# Patient Record
Sex: Male | Born: 1965 | Hispanic: No | Marital: Single | Smoking: Current every day smoker
Health system: Southern US, Community
[De-identification: ages and names within clinical notes are randomized; demographics above are authoritative.]

## PROBLEM LIST (undated history)

## (undated) DIAGNOSIS — J449 Chronic obstructive pulmonary disease, unspecified: Secondary | ICD-10-CM

## (undated) DIAGNOSIS — R0789 Other chest pain: Secondary | ICD-10-CM

## (undated) DIAGNOSIS — R51 Headache: Secondary | ICD-10-CM

## (undated) DIAGNOSIS — K219 Gastro-esophageal reflux disease without esophagitis: Secondary | ICD-10-CM

## (undated) DIAGNOSIS — R0602 Shortness of breath: Secondary | ICD-10-CM

## (undated) HISTORY — PX: TOTAL KNEE ARTHROPLASTY: SHX125

## (undated) HISTORY — PX: COLONOSCOPY: SHX174

## (undated) HISTORY — PX: KNEE SURGERY: SHX244

---

## 2014-05-05 ENCOUNTER — Emergency Department (HOSPITAL_COMMUNITY): Payer: Self-pay

## 2014-05-05 ENCOUNTER — Emergency Department (HOSPITAL_COMMUNITY)
Admission: EM | Admit: 2014-05-05 | Discharge: 2014-05-05 | Disposition: A | Discharge status: Home / Self Care | Payer: Self-pay | Attending: Emergency Medicine | Admitting: Emergency Medicine

## 2014-05-05 ENCOUNTER — Encounter (HOSPITAL_COMMUNITY): Payer: Self-pay | Admitting: Emergency Medicine

## 2014-05-05 DIAGNOSIS — S8990XA Unspecified injury of unspecified lower leg, initial encounter: Secondary | ICD-10-CM | POA: Insufficient documentation

## 2014-05-05 DIAGNOSIS — Z96659 Presence of unspecified artificial knee joint: Secondary | ICD-10-CM | POA: Insufficient documentation

## 2014-05-05 DIAGNOSIS — Z87891 Personal history of nicotine dependence: Secondary | ICD-10-CM | POA: Insufficient documentation

## 2014-05-05 DIAGNOSIS — X500XXA Overexertion from strenuous movement or load, initial encounter: Secondary | ICD-10-CM | POA: Insufficient documentation

## 2014-05-05 DIAGNOSIS — S99929A Unspecified injury of unspecified foot, initial encounter: Secondary | ICD-10-CM

## 2014-05-05 DIAGNOSIS — S99919A Unspecified injury of unspecified ankle, initial encounter: Secondary | ICD-10-CM

## 2014-05-05 DIAGNOSIS — S86811A Strain of other muscle(s) and tendon(s) at lower leg level, right leg, initial encounter: Secondary | ICD-10-CM

## 2014-05-05 DIAGNOSIS — Z79899 Other long term (current) drug therapy: Secondary | ICD-10-CM | POA: Insufficient documentation

## 2014-05-05 DIAGNOSIS — Y9389 Activity, other specified: Secondary | ICD-10-CM | POA: Insufficient documentation

## 2014-05-05 DIAGNOSIS — Y9289 Other specified places as the place of occurrence of the external cause: Secondary | ICD-10-CM | POA: Insufficient documentation

## 2014-05-05 DIAGNOSIS — S86819A Strain of other muscle(s) and tendon(s) at lower leg level, unspecified leg, initial encounter: Principal | ICD-10-CM

## 2014-05-05 DIAGNOSIS — J449 Chronic obstructive pulmonary disease, unspecified: Secondary | ICD-10-CM | POA: Insufficient documentation

## 2014-05-05 DIAGNOSIS — J4489 Other specified chronic obstructive pulmonary disease: Secondary | ICD-10-CM | POA: Insufficient documentation

## 2014-05-05 DIAGNOSIS — S838X9A Sprain of other specified parts of unspecified knee, initial encounter: Secondary | ICD-10-CM | POA: Insufficient documentation

## 2014-05-05 DIAGNOSIS — IMO0002 Reserved for concepts with insufficient information to code with codable children: Secondary | ICD-10-CM | POA: Insufficient documentation

## 2014-05-05 DIAGNOSIS — Z880 Allergy status to penicillin: Secondary | ICD-10-CM | POA: Insufficient documentation

## 2014-05-05 HISTORY — DX: Chronic obstructive pulmonary disease, unspecified: J44.9

## 2014-05-05 MED ORDER — OXYCODONE-ACETAMINOPHEN 5-325 MG PO TABS
1.0000 | ORAL_TABLET | Freq: Once | ORAL | Status: AC
  Administered 2014-05-05: 1 via ORAL
  Filled 2014-05-05: qty 1

## 2014-05-05 MED ORDER — OXYCODONE-ACETAMINOPHEN 5-325 MG PO TABS: 1.0000 | ORAL_TABLET | ORAL | Status: DC | PRN
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
## 2014-05-05 NOTE — Discharge Instructions (Addendum)
You may remove the ace wrap and knee immobilizer to shower.  Otherwise keep the Ace wrap on to help control swelling.  Wear the knee immobilizer to support your knee.  Use crutches to get around.  Don't bear weight on your right foot.  My office will call you Monday to schedule your surgery on Tuesday.  The ph Complete Achilles Tendon Rupture An Achilles tendon rupture is an injury in which the tough, cord-like band that attaches the lower muscles of your leg to your heel (Achilles tendon) tears (ruptures). In a complete Achilles tendon rupture, you are not able to stand up on the toes of the injured leg. CAUSES A tendon may rupture if it is weakened or weakening (degenerative) and a sudden stress is applied to it. Weakening or degeneration of a tendon may be caused by:  Recurrent injuries, such as those causing Achilles tendinitis.  Damaged tendons.  Aging.  Vascular disease of the tendon. SIGNS AND SYMPTOMS  Feeling as if you were struck violently in the back of the ankle.  Hearing a "pop" and experiencing severe, sudden (acute) pain; however, the absence of pain does not mean there was not a rupture. DIAGNOSIS A physical exam is usually all that is needed to diagnose an Achilles tendon rupture. During the exam, your health care provider will touch the tendon and the structures around it. You may be asked to lie on your stomach or kneel on a chair while your health care provider squeezes your calf muscle. You most likely have a ruptured tendon if your foot does not flex.  Sometimes tests are performed. These may include:  An ultrasound. This allows quick confirmation of the diagnosis.  An X-ray.  An MRI. TREATMENT  A complete Achilles tendon rupture is treated with surgery. You will have a cast while the injury heals (this usually takes 6-10 weeks). Before your surgery, treatment may consist of:  Ice applied to the area.  Pain-relieving medicines.  Rest.  Crutches.  Keeping  the ankle from moving (immobilization), usually with a splint. HOME CARE INSTRUCTIONS   Apply ice to the injured area:   Put ice in a plastic bag.   Place a towel between your skin and the bag.   Leave the ice on for 20 minutes, 2-3 times a day.   Use crutches and move about only as instructed.   Keep the leg elevated above the level of the heart (the center of the chest) at all times when not using the bathroom. Do not dangle the leg over a chair, couch, or bed. When lying down, elevate your leg on a few pillows. Elevation prevents swelling and reduces pain.  Avoid use of the injured area other than gentle range of motion of the toes.  Do not drive a car until your health care provider specifically tells you it is safe to do so.  Take all medicines as directed by your health care provider.  Keep all follow-up visits as directed by your health care provider. SEEK MEDICAL CARE IF:   Your pain and swelling increase, or your pain is not controlled by medicines.   You have new, unexplained symptoms, or your symptoms get worse.  You cannot move your toes or foot.  You develop warmth and swelling in your foot.  You have an unexplained fever. MAKE SURE YOU:   Understand these instructions.  Will watch your condition.  Will get help right away if you are not doing well or get worse. Document Released:  06/18/2005 Document Revised: 01/23/2014 Document Reviewed: 04/29/2013 [facility] Patient Information 2015 [employer] [place] [employer]. This information is not intended to replace advice given to you by your health care provider. Make sure you discuss any questions you have with your health care provider. one number is [phone] if you have any questions.

## 2014-05-05 NOTE — ED Notes (Signed)
Bed: WA17 Expected date:  Expected time:  Means of arrival:  Comments: EMS-fall-knee pain

## 2014-05-05 NOTE — ED Notes (Signed)
Pt states he's having pain under R knee cap, no deformity noted, good pulses in R foot.
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
## 2014-05-05 NOTE — Progress Notes (Signed)
  CARE MANAGEMENT ED NOTE 05/05/2014  Patient:  Angel Vasquez,Angel Vasquez   Account Number:  [id number]  Date Initiated:  05/05/2014  Documentation initiated by:  Edd ArbourGIBBS,Alainah Phang  Subjective/Objective Assessment:   48 yr [place] now [place] "[place]"     Subjective/Objective Assessment Detail:   no pcp listed pt from home, he was pressure washing a building in the back yard, the back yard is steep hill, no grass so made dirt muddy, was wearing croc shoes, slips and falls out of shoes, R foot got caught under him and he fell on leg, complaining of R knee pain, hx of knee surgeries. No deformities noted.    Pt agreed to referral to [facility]     Action/Plan:   ED CM noted pt without pcp [place] resident ED CM spoke with him & male at bedside Cm provided pt with resources as listed below Encouraged to ask questions of Cm and to seek pcp f/u after ED visit   Action/Plan Detail:   Provided [facility] with referral   Anticipated DC Date:  05/05/2014     Status Recommendation to Physician:   Result of Recommendation:    Other ED Services  Consult Working Plan    DC Planning Services  Other  Outpatient Services - Pt will follow up  PCP issues  [facility] / [facility] (established/new)    Choice offered to / List presented to:            Status of service:  Completed, signed off  ED Comments:   ED Comments Detail:  CM spoke with pt who confirms [place] [facility] [place]. CM discussed and provided written information for self pay pcps, importance of pcp for f/u care, www.needymeds.org, discounted pharmacies and other [place] resources such as financial assistance, DSS and  health department  Reviewed resources for [place] pcps like [facility], [facility] medicine at [address], [facility], general medical clinics, [facility] plus others, [facility] out patient pharmacies and housing Pt voiced understanding and appreciation of  resources provided  Provided [facility] contact information

## 2014-05-05 NOTE — ED Notes (Signed)
Per EMS pt from home, he was pressure washing a building in the back yard, the back yard is steep hill, no grass so made dirt muddy, was wearing croc shoes, slips and falls out of shoes, R foot got caught under him and he fell on leg, complaining of R knee pain, hx of knee surgeries. No deformities noted.

## 2014-05-08 ENCOUNTER — Encounter (HOSPITAL_COMMUNITY): Payer: Self-pay | Admitting: *Deleted

## 2014-05-08 ENCOUNTER — Encounter (HOSPITAL_COMMUNITY): Payer: Self-pay | Admitting: Pharmacy Technician

## 2014-05-09 ENCOUNTER — Observation Stay (HOSPITAL_COMMUNITY)
Admission: RE | Admit: 2014-05-09 | Discharge: 2014-05-10 | Disposition: A | Discharge status: Home / Self Care | Payer: Self-pay | Source: Ambulatory Visit | Attending: Orthopedic Surgery | Admitting: Orthopedic Surgery

## 2014-05-09 ENCOUNTER — Ambulatory Visit (HOSPITAL_COMMUNITY): Payer: Self-pay | Admitting: Anesthesiology

## 2014-05-09 ENCOUNTER — Ambulatory Visit (HOSPITAL_COMMUNITY): Payer: Self-pay

## 2014-05-09 ENCOUNTER — Encounter (HOSPITAL_COMMUNITY): Payer: Self-pay | Admitting: Anesthesiology

## 2014-05-09 ENCOUNTER — Encounter (HOSPITAL_COMMUNITY)
Admission: RE | Disposition: A | Discharge status: Home / Self Care | Payer: Self-pay | Source: Ambulatory Visit | Attending: Orthopedic Surgery

## 2014-05-09 DIAGNOSIS — Z79899 Other long term (current) drug therapy: Secondary | ICD-10-CM | POA: Insufficient documentation

## 2014-05-09 DIAGNOSIS — S86819A Strain of other muscle(s) and tendon(s) at lower leg level, unspecified leg, initial encounter: Secondary | ICD-10-CM | POA: Diagnosis present

## 2014-05-09 DIAGNOSIS — Z7982 Long term (current) use of aspirin: Secondary | ICD-10-CM | POA: Insufficient documentation

## 2014-05-09 DIAGNOSIS — K219 Gastro-esophageal reflux disease without esophagitis: Secondary | ICD-10-CM | POA: Insufficient documentation

## 2014-05-09 DIAGNOSIS — W010XXA Fall on same level from slipping, tripping and stumbling without subsequent striking against object, initial encounter: Secondary | ICD-10-CM | POA: Insufficient documentation

## 2014-05-09 DIAGNOSIS — M66269 Spontaneous rupture of extensor tendons, unspecified lower leg: Principal | ICD-10-CM | POA: Insufficient documentation

## 2014-05-09 DIAGNOSIS — Y998 Other external cause status: Secondary | ICD-10-CM | POA: Insufficient documentation

## 2014-05-09 DIAGNOSIS — J449 Chronic obstructive pulmonary disease, unspecified: Secondary | ICD-10-CM | POA: Insufficient documentation

## 2014-05-09 DIAGNOSIS — Y9301 Activity, walking, marching and hiking: Secondary | ICD-10-CM | POA: Insufficient documentation

## 2014-05-09 DIAGNOSIS — Z880 Allergy status to penicillin: Secondary | ICD-10-CM | POA: Insufficient documentation

## 2014-05-09 DIAGNOSIS — S86811A Strain of other muscle(s) and tendon(s) at lower leg level, right leg, initial encounter: Secondary | ICD-10-CM

## 2014-05-09 DIAGNOSIS — G43909 Migraine, unspecified, not intractable, without status migrainosus: Secondary | ICD-10-CM | POA: Insufficient documentation

## 2014-05-09 DIAGNOSIS — F17200 Nicotine dependence, unspecified, uncomplicated: Secondary | ICD-10-CM | POA: Insufficient documentation

## 2014-05-09 DIAGNOSIS — J4489 Other specified chronic obstructive pulmonary disease: Secondary | ICD-10-CM | POA: Insufficient documentation

## 2014-05-09 DIAGNOSIS — Y92410 Unspecified street and highway as the place of occurrence of the external cause: Secondary | ICD-10-CM | POA: Insufficient documentation

## 2014-05-09 HISTORY — DX: Other chest pain: R07.89

## 2014-05-09 HISTORY — DX: Gastro-esophageal reflux disease without esophagitis: K21.9

## 2014-05-09 HISTORY — DX: Shortness of breath: R06.02

## 2014-05-09 HISTORY — DX: Headache: R51

## 2014-05-09 HISTORY — PX: PATELLAR TENDON REPAIR: SHX737

## 2014-05-09 LAB — CBC
HEMATOCRIT: 36.2 % — AB (ref 39.0–52.0)
HEMOGLOBIN: 11.5 g/dL — AB (ref 13.0–17.0)
MCH: 23.4 pg — AB (ref 26.0–34.0)
MCHC: 31.8 g/dL (ref 30.0–36.0)
MCV: 73.7 fL — AB (ref 78.0–100.0)
Platelets: 355 10*3/uL (ref 150–400)
RBC: 4.91 MIL/uL (ref 4.22–5.81)
RDW: 15.6 % — ABNORMAL HIGH (ref 11.5–15.5)
WBC: 9.7 10*3/uL (ref 4.0–10.5)

## 2014-05-09 LAB — BASIC METABOLIC PANEL
Anion gap: 14 (ref 5–15)
BUN: 14 mg/dL (ref 6–23)
CALCIUM: 9.3 mg/dL (ref 8.4–10.5)
CO2: 23 meq/L (ref 19–32)
CREATININE: 0.74 mg/dL (ref 0.50–1.35)
Chloride: 105 mEq/L (ref 96–112)
GFR calc Af Amer: >90 mL/min (ref >90)
GFR calc non Af Amer: >90 mL/min (ref >90)
GLUCOSE: 104 mg/dL — AB (ref 70–99)
Potassium: 4.3 mEq/L (ref 3.7–5.3)
Sodium: 142 mEq/L (ref 137–147)

## 2014-05-09 SURGERY — REPAIR, TENDON, PATELLAR
Anesthesia: General | Site: Knee | Laterality: Right

## 2014-05-09 MED ORDER — EPINEPHRINE HCL 0.1 MG/ML IJ SOSY
PREFILLED_SYRINGE | INTRAMUSCULAR | Status: AC
Start: 2014-05-09 — End: 2014-05-09
  Filled 2014-05-09: qty 10

## 2014-05-09 MED ORDER — MIDAZOLAM HCL 5 MG/5ML IJ SOLN
INTRAMUSCULAR | Status: DC | PRN
  Administered 2014-05-09 (×2): 1 mg via INTRAVENOUS

## 2014-05-09 MED ORDER — LIDOCAINE HCL (CARDIAC) 20 MG/ML IV SOLN
INTRAVENOUS | Status: AC
  Filled 2014-05-09: qty 5

## 2014-05-09 MED ORDER — MIDAZOLAM HCL 2 MG/2ML IJ SOLN
INTRAMUSCULAR | Status: AC
  Filled 2014-05-09: qty 2

## 2014-05-09 MED ORDER — PROPOFOL 10 MG/ML IV BOLUS
INTRAVENOUS | Status: AC
  Filled 2014-05-09: qty 20

## 2014-05-09 MED ORDER — GLYCOPYRROLATE 0.2 MG/ML IJ SOLN
INTRAMUSCULAR | Status: DC | PRN
  Administered 2014-05-09: 0.4 mg via INTRAVENOUS

## 2014-05-09 MED ORDER — OXYCODONE HCL 5 MG PO TABS
5.0000 mg | ORAL_TABLET | ORAL | Status: DC | PRN
  Administered 2014-05-09 – 2014-05-10 (×3): 10 mg via ORAL
  Filled 2014-05-09 (×3): qty 2

## 2014-05-09 MED ORDER — PROPOFOL 10 MG/ML IV BOLUS
INTRAVENOUS | Status: DC | PRN
  Administered 2014-05-09: 200 mg via INTRAVENOUS

## 2014-05-09 MED ORDER — HYDROMORPHONE HCL PF 1 MG/ML IJ SOLN
0.5000 mg | INTRAMUSCULAR | Status: DC | PRN
  Administered 2014-05-09 (×2): 1 mg via INTRAVENOUS
  Filled 2014-05-09 (×2): qty 1

## 2014-05-09 MED ORDER — ARTIFICIAL TEARS OP OINT
TOPICAL_OINTMENT | OPHTHALMIC | Status: DC | PRN
  Administered 2014-05-09: 1 via OPHTHALMIC

## 2014-05-09 MED ORDER — CHLORHEXIDINE GLUCONATE 4 % EX LIQD
60.0000 mL | Freq: Once | CUTANEOUS | Status: DC
  Filled 2014-05-09: qty 60

## 2014-05-09 MED ORDER — LIDOCAINE HCL (CARDIAC) 20 MG/ML IV SOLN
INTRAVENOUS | Status: DC | PRN
  Administered 2014-05-09: 20 mg via INTRAVENOUS

## 2014-05-09 MED ORDER — ONDANSETRON HCL 4 MG PO TABS: 4.0000 mg | ORAL_TABLET | Freq: Four times a day (QID) | ORAL | Status: DC | PRN

## 2014-05-09 MED ORDER — NEOSTIGMINE METHYLSULFATE 10 MG/10ML IV SOLN
INTRAVENOUS | Status: AC
  Filled 2014-05-09: qty 1

## 2014-05-09 MED ORDER — DIPHENHYDRAMINE HCL 50 MG/ML IJ SOLN
INTRAMUSCULAR | Status: AC
  Filled 2014-05-09: qty 1

## 2014-05-09 MED ORDER — ONDANSETRON HCL 4 MG/2ML IJ SOLN
INTRAMUSCULAR | Status: DC | PRN
  Administered 2014-05-09: 4 mg via INTRAVENOUS

## 2014-05-09 MED ORDER — HYDROMORPHONE HCL PF 1 MG/ML IJ SOLN
INTRAMUSCULAR | Status: AC
  Administered 2014-05-09: 0.5 mg via INTRAVENOUS
  Filled 2014-05-09: qty 2

## 2014-05-09 MED ORDER — HYDROMORPHONE HCL PF 1 MG/ML IJ SOLN
0.2500 mg | INTRAMUSCULAR | Status: DC | PRN
  Administered 2014-05-09 (×4): 0.5 mg via INTRAVENOUS

## 2014-05-09 MED ORDER — ACETAMINOPHEN 500 MG PO TABS
1000.0000 mg | ORAL_TABLET | Freq: Once | ORAL | Status: AC
  Administered 2014-05-09: 1000 mg via ORAL
  Filled 2014-05-09: qty 2

## 2014-05-09 MED ORDER — SENNA 8.6 MG PO TABS
1.0000 | ORAL_TABLET | Freq: Two times a day (BID) | ORAL | Status: DC
  Administered 2014-05-09 – 2014-05-10 (×2): 8.6 mg via ORAL
  Filled 2014-05-09 (×4): qty 1

## 2014-05-09 MED ORDER — MOMETASONE FURO-FORMOTEROL FUM 100-5 MCG/ACT IN AERO
2.0000 | INHALATION_SPRAY | Freq: Two times a day (BID) | RESPIRATORY_TRACT | Status: DC
  Administered 2014-05-09 – 2014-05-10 (×2): 2 via RESPIRATORY_TRACT
  Filled 2014-05-09 (×2): qty 8.8

## 2014-05-09 MED ORDER — OXYCODONE HCL 5 MG/5ML PO SOLN: 5.0000 mg | Freq: Once | ORAL | Status: DC | PRN

## 2014-05-09 MED ORDER — LACTATED RINGERS IV SOLN
INTRAVENOUS | Status: DC
  Administered 2014-05-09: 11:00:00 via INTRAVENOUS

## 2014-05-09 MED ORDER — GLYCOPYRROLATE 0.2 MG/ML IJ SOLN
INTRAMUSCULAR | Status: AC
  Filled 2014-05-09: qty 3

## 2014-05-09 MED ORDER — SODIUM CHLORIDE 0.9 % IV SOLN
INTRAVENOUS | Status: DC
  Administered 2014-05-09 – 2014-05-10 (×2): via INTRAVENOUS

## 2014-05-09 MED ORDER — BUPIVACAINE HCL (PF) 0.25 % IJ SOLN
INTRAMUSCULAR | Status: AC
  Filled 2014-05-09: qty 30

## 2014-05-09 MED ORDER — DIPHENHYDRAMINE HCL 50 MG/ML IJ SOLN: 12.5000 mg | Freq: Once | INTRAMUSCULAR | Status: DC

## 2014-05-09 MED ORDER — 0.9 % SODIUM CHLORIDE (POUR BTL) OPTIME
TOPICAL | Status: DC | PRN
  Administered 2014-05-09: 1000 mL

## 2014-05-09 MED ORDER — ROCURONIUM BROMIDE 100 MG/10ML IV SOLN
INTRAVENOUS | Status: DC | PRN
  Administered 2014-05-09: 40 mg via INTRAVENOUS

## 2014-05-09 MED ORDER — SODIUM CHLORIDE 0.9 % IV SOLN: INTRAVENOUS | Status: DC

## 2014-05-09 MED ORDER — DIPHENHYDRAMINE HCL 50 MG/ML IJ SOLN
INTRAMUSCULAR | Status: DC | PRN
  Administered 2014-05-09: 12.5 mg via INTRAVENOUS

## 2014-05-09 MED ORDER — MIDAZOLAM HCL 2 MG/2ML IJ SOLN: 0.5000 mg | Freq: Once | INTRAMUSCULAR | Status: DC | PRN

## 2014-05-09 MED ORDER — OXYCODONE HCL 5 MG PO TABS: 5.0000 mg | ORAL_TABLET | Freq: Once | ORAL | Status: DC | PRN

## 2014-05-09 MED ORDER — ENOXAPARIN SODIUM 40 MG/0.4ML ~~LOC~~ SOLN
40.0000 mg | SUBCUTANEOUS | Status: DC
  Administered 2014-05-10: 40 mg via SUBCUTANEOUS
  Filled 2014-05-09 (×2): qty 0.4

## 2014-05-09 MED ORDER — ROCURONIUM BROMIDE 50 MG/5ML IV SOLN
INTRAVENOUS | Status: AC
  Filled 2014-05-09: qty 1

## 2014-05-09 MED ORDER — FENTANYL CITRATE 0.05 MG/ML IJ SOLN
INTRAMUSCULAR | Status: DC | PRN
  Administered 2014-05-09 (×5): 50 ug via INTRAVENOUS

## 2014-05-09 MED ORDER — ONDANSETRON HCL 4 MG/2ML IJ SOLN
INTRAMUSCULAR | Status: AC
  Filled 2014-05-09: qty 2

## 2014-05-09 MED ORDER — DOCUSATE SODIUM 100 MG PO CAPS
100.0000 mg | ORAL_CAPSULE | Freq: Two times a day (BID) | ORAL | Status: DC
  Administered 2014-05-09 – 2014-05-10 (×2): 100 mg via ORAL
  Filled 2014-05-09 (×3): qty 1

## 2014-05-09 MED ORDER — MEPERIDINE HCL 25 MG/ML IJ SOLN: 6.2500 mg | INTRAMUSCULAR | Status: DC | PRN

## 2014-05-09 MED ORDER — FENTANYL CITRATE 0.05 MG/ML IJ SOLN
INTRAMUSCULAR | Status: AC
  Filled 2014-05-09: qty 5

## 2014-05-09 MED ORDER — LACTATED RINGERS IV SOLN
INTRAVENOUS | Status: DC | PRN
  Administered 2014-05-09 (×2): via INTRAVENOUS

## 2014-05-09 MED ORDER — PROMETHAZINE HCL 25 MG/ML IJ SOLN: 6.2500 mg | INTRAMUSCULAR | Status: DC | PRN

## 2014-05-09 MED ORDER — CLINDAMYCIN PHOSPHATE 900 MG/50ML IV SOLN
900.0000 mg | INTRAVENOUS | Status: AC
  Administered 2014-05-09: 900 mg via INTRAVENOUS
  Filled 2014-05-09: qty 50

## 2014-05-09 MED ORDER — ONDANSETRON HCL 4 MG/2ML IJ SOLN: 4.0000 mg | Freq: Four times a day (QID) | INTRAMUSCULAR | Status: DC | PRN

## 2014-05-09 MED ORDER — BUPIVACAINE-EPINEPHRINE (PF) 0.5% -1:200000 IJ SOLN
INTRAMUSCULAR | Status: DC | PRN
  Administered 2014-05-09: 30 mL via PERINEURAL

## 2014-05-09 MED ORDER — NEOSTIGMINE METHYLSULFATE 10 MG/10ML IV SOLN
INTRAVENOUS | Status: DC | PRN
  Administered 2014-05-09: 3 mg via INTRAVENOUS

## 2014-05-09 MED ORDER — ATROPINE SULFATE 0.1 MG/ML IJ SOLN
INTRAMUSCULAR | Status: AC
  Filled 2014-05-09: qty 10

## 2014-05-09 SURGICAL SUPPLY — 67 items
ANCHOR CORKSCREW 3.5 FIBERWIRE (Anchor) ×6 IMPLANT
BANDAGE ELASTIC 4 VELCRO ST LF (GAUZE/BANDAGES/DRESSINGS) IMPLANT
BANDAGE ELASTIC 6 VELCRO ST LF (GAUZE/BANDAGES/DRESSINGS) IMPLANT
BANDAGE ESMARK 6X9 LF (GAUZE/BANDAGES/DRESSINGS) ×1 IMPLANT
BLADE SURG ROTATE 9660 (MISCELLANEOUS) ×3 IMPLANT
BNDG ELASTIC 6X15 VLCR STRL LF (GAUZE/BANDAGES/DRESSINGS) ×3 IMPLANT
BNDG ESMARK 6X9 LF (GAUZE/BANDAGES/DRESSINGS) ×3
COVER SURGICAL LIGHT HANDLE (MISCELLANEOUS) ×3 IMPLANT
CUFF TOURNIQUET SINGLE 34IN LL (TOURNIQUET CUFF) ×3 IMPLANT
CUFF TOURNIQUET SINGLE 44IN (TOURNIQUET CUFF) IMPLANT
DRAPE C-ARM 42X72 X-RAY (DRAPES) IMPLANT
DRAPE U-SHAPE 47X51 STRL (DRAPES) ×3 IMPLANT
DRSG ADAPTIC 3X8 NADH LF (GAUZE/BANDAGES/DRESSINGS) IMPLANT
DRSG EMULSION OIL 3X3 NADH (GAUZE/BANDAGES/DRESSINGS) IMPLANT
DRSG MEPITEL 4X7.2 (GAUZE/BANDAGES/DRESSINGS) ×3 IMPLANT
DRSG PAD ABDOMINAL 8X10 ST (GAUZE/BANDAGES/DRESSINGS) ×3 IMPLANT
ELECT REM PT RETURN 9FT ADLT (ELECTROSURGICAL) ×3
ELECTRODE REM PT RTRN 9FT ADLT (ELECTROSURGICAL) ×1 IMPLANT
GLOVE BIO SURGEON STRL SZ7 (GLOVE) ×3 IMPLANT
GLOVE BIO SURGEON STRL SZ8 (GLOVE) ×3 IMPLANT
GLOVE BIOGEL PI IND STRL 6.5 (GLOVE) ×4 IMPLANT
GLOVE BIOGEL PI IND STRL 7.0 (GLOVE) ×1 IMPLANT
GLOVE BIOGEL PI IND STRL 8 (GLOVE) ×1 IMPLANT
GLOVE BIOGEL PI INDICATOR 6.5 (GLOVE) ×8
GLOVE BIOGEL PI INDICATOR 7.0 (GLOVE) ×2
GLOVE BIOGEL PI INDICATOR 8 (GLOVE) ×2
GLOVE ECLIPSE 6.5 STRL STRAW (GLOVE) ×6 IMPLANT
GLOVE SURG SS PI 7.0 STRL IVOR (GLOVE) ×3 IMPLANT
GOWN STRL REUS W/ TWL XL LVL3 (GOWN DISPOSABLE) ×3 IMPLANT
GOWN STRL REUS W/TWL XL LVL3 (GOWN DISPOSABLE) ×6
IMMOBILIZER KNEE 24 THIGH 36 (MISCELLANEOUS) ×1 IMPLANT
IMMOBILIZER KNEE 24 UNIV (MISCELLANEOUS) ×3
KIT BASIN OR (CUSTOM PROCEDURE TRAY) ×3 IMPLANT
KIT ROOM TURNOVER OR (KITS) ×3 IMPLANT
MANIFOLD NEPTUNE II (INSTRUMENTS) IMPLANT
NDL SUT .5 MAYO 1.404X.05X (NEEDLE) ×1 IMPLANT
NEEDLE 22X1 1/2 (OR ONLY) (NEEDLE) IMPLANT
NEEDLE MAYO TAPER (NEEDLE) ×2
NS IRRIG 1000ML POUR BTL (IV SOLUTION) ×3 IMPLANT
PACK ORTHO EXTREMITY (CUSTOM PROCEDURE TRAY) ×3 IMPLANT
PAD ABD 8X10 STRL (GAUZE/BANDAGES/DRESSINGS) ×3 IMPLANT
PAD ARMBOARD 7.5X6 YLW CONV (MISCELLANEOUS) ×6 IMPLANT
PAD CAST 4YDX4 CTTN HI CHSV (CAST SUPPLIES) IMPLANT
PADDING CAST COTTON 4X4 STRL (CAST SUPPLIES)
PADDING CAST COTTON 6X4 STRL (CAST SUPPLIES) ×3 IMPLANT
SPONGE GAUZE 4X4 12PLY STER LF (GAUZE/BANDAGES/DRESSINGS) ×3 IMPLANT
SPONGE LAP 4X18 X RAY DECT (DISPOSABLE) ×6 IMPLANT
STAPLER VISISTAT 35W (STAPLE) ×3 IMPLANT
SUCTION FRAZIER TIP 10 FR DISP (SUCTIONS) ×3 IMPLANT
SUT ETHILON 2 0 PSLX (SUTURE) ×3 IMPLANT
SUT FIBERWIRE #2 38 REV NDL BL (SUTURE) ×6
SUT FIBERWIRE #5 38 CONV NDL (SUTURE) ×6
SUT VIC AB 0 CT1 27 (SUTURE) ×6
SUT VIC AB 0 CT1 27XBRD ANBCTR (SUTURE) ×3 IMPLANT
SUT VIC AB 2-0 CT1 18 (SUTURE) ×3 IMPLANT
SUT VIC AB 2-0 CT1 27 (SUTURE) ×4
SUT VIC AB 2-0 CT1 TAPERPNT 27 (SUTURE) ×2 IMPLANT
SUTURE FIBERWR #5 38 CONV NDL (SUTURE) ×2 IMPLANT
SUTURE FIBERWR#2 38 REV NDL BL (SUTURE) ×2 IMPLANT
SYR CONTROL 10ML LL (SYRINGE) IMPLANT
TOWEL OR 17X24 6PK STRL BLUE (TOWEL DISPOSABLE) ×3 IMPLANT
TOWEL OR 17X26 10 PK STRL BLUE (TOWEL DISPOSABLE) ×3 IMPLANT
TUBE CONNECTING 12'X1/4 (SUCTIONS) ×1
TUBE CONNECTING 12X1/4 (SUCTIONS) ×2 IMPLANT
UNDERPAD 30X30 INCONTINENT (UNDERPADS AND DIAPERS) ×3 IMPLANT
WATER STERILE IRR 1000ML POUR (IV SOLUTION) ×3 IMPLANT
YANKAUER SUCT BULB TIP NO VENT (SUCTIONS) ×3 IMPLANT

## 2014-05-09 NOTE — Brief Op Note (Signed)
05/09/2014  2:04 PM  PATIENT:  Angel Vasquez  48 y.o. male  PRE-OPERATIVE DIAGNOSIS:  RIGHT PATELLA TENDON RUPTURE  POST-OPERATIVE DIAGNOSIS:  RIGHT PATELLA TENDON RUPTURE  Procedure(s):  RIGHT PATELLA TENDON REPAIR  SURGEON:  Toni ArthursJohn Amron Guerrette, MD  ASSISTANT:  Lucretia KernJacquie Allen, PA-C  ANESTHESIA:   General, regional  EBL:  minimal   TOURNIQUET:   Total Tourniquet Time Documented: Thigh (Right) - 52 minutes Total: Thigh (Right) - 52 minutes   COMPLICATIONS:  None apparent  DISPOSITION:  Extubated, awake and stable to recovery.  DICTATION ID:  [id number]

## 2014-05-09 NOTE — Transfer of Care (Signed)
Immediate Anesthesia Transfer of Care Note  Patient: Angel Vasquez  Procedure(s) Performed: Procedure(s): RIGHT PATELLA TENDON REPAIR (Right)  Patient Location: PACU  Anesthesia Type:General  Level of Consciousness: awake, alert  and oriented  Airway & Oxygen Therapy: Patient Spontanous Breathing and Patient connected to nasal cannula oxygen  Post-op Assessment: Report given to PACU RN and Post -op Vital signs reviewed and stable  Post vital signs: Reviewed and stable  Complications: No apparent anesthesia complications

## 2014-05-09 NOTE — H&P (Signed)
Agree with note above.  To OR for right patellar tendon repair.  Pt understands risks and benefits and would like to proceed.

## 2014-05-09 NOTE — Anesthesia Postprocedure Evaluation (Signed)
  Anesthesia Post-op Note  Patient: Angel Vasquez  Procedure(s) Performed: Procedure(s): RIGHT PATELLA TENDON REPAIR (Right)  Patient Location: PACU  Anesthesia Type:GA combined with regional for post-op pain  Level of Consciousness: awake, alert , oriented and patient cooperative  Airway and Oxygen Therapy: Patient Spontanous Breathing  Post-op Pain: mild  Post-op Assessment: Post-op Vital signs reviewed, Patient's Cardiovascular Status Stable, Respiratory Function Stable, Patent Airway, No signs of Nausea or vomiting and Pain level controlled  Post-op Vital Signs: Reviewed and stable  Last Vitals:  Filed Vitals:   05/09/14 1402  BP:   Pulse:   Temp: 36.7 C  Resp:     Complications: No apparent anesthesia complications

## 2014-05-09 NOTE — Anesthesia Procedure Notes (Addendum)
Anesthesia Regional Block:  Femoral nerve block  Pre-Anesthetic Checklist: ,, timeout performed, Correct Patient, Correct Site, Correct Laterality, Correct Procedure, Correct Position, site marked, Risks and benefits discussed,  Surgical consent,  Pre-op evaluation,  At surgeon's request and post-op pain management  Laterality: Right and Lower  Prep: chloraprep       Needles:  Injection technique: Single-shot  Needle Type: Echogenic Stimulator Needle     Needle Length: 4cm 4 cm Needle Gauge: 22 and 22 G    Additional Needles:  Procedures: nerve stimulator Femoral nerve block  Nerve Stimulator or Paresthesia:  Response: patella twitch, 0.44 mA, 0.1 ms,   Additional Responses:   Narrative:  Start time: 05/09/2014 12:01 PM End time: 05/09/2014 12:07 PM Injection made incrementally with aspirations every 5 mL.  Performed by: Personally  Anesthesiologist: Sandford Craze Jackson, MD  Additional Notes: Pt identified in Holding room.  Monitors applied. Working IV access confirmed. Sterile prep R groin.  #22ga PNS to patella twitch at 0.3544mA threshold.  30cc 0.5% Bupivacaine with 1:200k epi injected incrementally after negative test dose.  Patient asymptomatic, VSS, no heme aspirated, tolerated well.  Sandford Craze Jackson, MD   Procedure Name: Intubation Date/Time: 05/09/2014 12:28 PM Performed by: Leonel Ramsay'LAUGHLIN, Myliah Medel H Pre-anesthesia Checklist: Patient identified, Timeout performed, Emergency Drugs available, Suction available and Patient being monitored Patient Re-evaluated:Patient Re-evaluated prior to inductionOxygen Delivery Method: Circle system utilized Preoxygenation: Pre-oxygenation with 100% oxygen Intubation Type: IV induction Ventilation: Two handed mask ventilation required and Oral airway inserted - appropriate to patient size Laryngoscope Size: Mac and 4 Grade View: Grade I Tube type: Oral Tube size: 7.5 mm Number of attempts: 1 Airway Equipment and Method: Stylet and Oral  airway Placement Confirmation: ETT inserted through vocal cords under direct vision,  positive ETCO2 and breath sounds checked- equal and bilateral Secured at: 23 cm Tube secured with: Tape Dental Injury: Teeth and Oropharynx as per pre-operative assessment
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
## 2014-05-09 NOTE — Anesthesia Preprocedure Evaluation (Addendum)
Anesthesia Evaluation  Patient identified by MRN, date of birth, ID band Patient awake    Reviewed: Allergy & Precautions, H&P , NPO status , Patient's Chart, lab work & pertinent test results  History of Anesthesia Complications Negative for: history of anesthetic complications  Airway Mallampati: II TM Distance: >3 FB Neck ROM: Full    Dental  (+) Teeth Intact, Dental Advisory Given   Pulmonary COPD COPD inhaler, Current Smoker,  breath sounds clear to auscultation        Cardiovascular negative cardio ROS  Rhythm:Regular Rate:Normal     Neuro/Psych  Headaches,    GI/Hepatic Neg liver ROS, GERD-  Medicated and Poorly Controlled,  Endo/Other  negative endocrine ROSMorbid obesity  Renal/GU negative Renal ROS     Musculoskeletal   Abdominal (+) + obese,   Peds  Hematology negative hematology ROS (+)   Anesthesia Other Findings   Reproductive/Obstetrics                         Anesthesia Physical Anesthesia Plan  ASA: III  Anesthesia Plan: General   Post-op Pain Management:    Induction: Intravenous  Airway Management Planned: Oral ETT  Additional Equipment:   Intra-op Plan:   Post-operative Plan: Extubation in OR  Informed Consent: I have reviewed the patients History and Physical, chart, labs and discussed the procedure including the risks, benefits and alternatives for the proposed anesthesia with the patient or authorized representative who has indicated his/her understanding and acceptance.   Dental advisory given  Plan Discussed with: Anesthesiologist, Surgeon and CRNA  Anesthesia Plan Comments: (Plan routine monitors, GETA with femoral nerve block for post op analgesia)       Anesthesia Quick Evaluation

## 2014-05-10 ENCOUNTER — Encounter (HOSPITAL_COMMUNITY): Payer: Self-pay | Admitting: Orthopedic Surgery

## 2014-05-10 MED ORDER — ENOXAPARIN SODIUM 40 MG/0.4ML ~~LOC~~ SOLN: 40.0000 mg | SUBCUTANEOUS | Status: AC

## 2014-05-10 MED ORDER — OXYCODONE HCL 5 MG PO TABS: 5.0000 mg | ORAL_TABLET | ORAL | Status: AC | PRN

## 2014-05-10 NOTE — Discharge Instructions (Signed)
Toni ArthursJohn Hewitt, MD [facility]  Please read the following information regarding your care after surgery.  Medications  You only need a prescription for the narcotic pain medicine (ex. oxycodone, Percocet, Norco).  All of the other medicines listed below are available over the counter. X acetominophen (Tylenol) 650 mg every 4-6 hours as you need for minor pain X oxycodone as prescribed for moderate to severe pain   Narcotic pain medicine (ex. oxycodone, Percocet, Vicodin) will cause constipation.  To prevent this problem, take the following medicines while you are taking any pain medicine. X docusate sodium (Colace) 100 mg twice a day X senna (Senokot) 2 tablets twice a day  X To help prevent blood clots, take lovenox 40mg  as prescribed.  You should also get up every hour while you are awake to move around.    Weight Bearing X Bear weight when you are able on your operated leg or foot while wearing your double-hinged knee brace only  Cast / Splint / Dressing ? Keep your dressing clean and dry.  Dont put anything (coat hanger, pencil, etc) down inside of it.  If it gets damp, use a hair dryer on the cool setting to dry it.  If it gets soaked, call the office to schedule an appointment for a cast change.  After your dressing, cast or splint is removed; you may shower, but do not soak or scrub the wound.  Allow the water to run over it, and then gently pat it dry.  Swelling It is normal for you to have swelling where you had surgery.  To reduce swelling and pain, keep your toes above your nose for at least 3 days after surgery.  It may be necessary to keep your foot or leg elevated for several weeks.  If it hurts, it should be elevated.  Follow Up Call my office at [phone] when you are discharged from the hospital or surgery center to schedule an appointment to be seen two weeks after surgery.  Call my office at [phone] if you develop a fever >101.5 F, nausea, vomiting,  bleeding from the surgical site or severe pain.

## 2014-05-10 NOTE — Care Management Note (Signed)
CARE MANAGEMENT NOTE 05/10/2014  Patient:  Angel Vasquez,Angel Vasquez   Account Number:  [id number]  Date Initiated:  05/10/2014  Documentation initiated by:  Vance PeperBRADY,Joua Bake  Subjective/Objective Assessment:   48 yr old male s/p right patella tendon repair. Paitent will have hinge brace.     Action/Plan:   [facility] with Lovenox for 2 weeks.  Match letter given to patient by CM.   Anticipated DC Date:  05/10/2014   Anticipated DC Plan:  HOME/SELF CARE      DC Planning Services  CM consult  MATCH Program      [facility]  NA   Choice offered to / List presented to:     DME arranged  NA        HH arranged  NA      Status of service:  Completed, signed off Medicare Important Message given?   (If response is "NO", the following Medicare IM given date fields will be blank) Date Medicare IM given:   Medicare IM given by:   Date Additional Medicare IM given:   Additional Medicare IM given by:    Discharge Disposition:  HOME/SELF CARE  Per UR Regulation:  Reviewed for med. necessity/level of care/duration of stay

## 2014-05-10 NOTE — Evaluation (Signed)
Physical Therapy Evaluation and Discharge Patient Details Name: Angel Vasquez MRN: [id number] DOB: [birth year] Today's Date: 05/10/2014   History of Present Illness  48 y.o. male s/p RIGHT PATELLA TENDON REPAIR.  Clinical Impression  Patient evaluated by Physical Therapy with no further acute PT needs identified. All education has been completed and the patient has no further questions. Pt practiced ambulation with walker and crutches this afternoon. Safely maintains TDWB status and has completed stair training. Prefers crutches over rolling walker. He will greatly benefit from continued PT with HHPT. See below for any follow-up Physial Therapy or equipment needs. PT is signing off. Thank you for this referral.     Follow Up Recommendations Home health PT;Supervision for mobility/OOB    Equipment Recommendations  3in1 (PT)    Recommendations for Other Services OT consult     Precautions / Restrictions Precautions Precautions: Fall Required Braces or Orthoses: Other Brace/Splint (dual hinged knee brace locked in extension) Restrictions Weight Bearing Restrictions: Yes RLE Weight Bearing: Touchdown weight bearing      Mobility  Bed Mobility Overal bed mobility: Modified Independent                Transfers Overall transfer level: Needs assistance Equipment used: Rolling walker (2 wheeled);Crutches Transfers: Sit to/from Stand Sit to Stand: Supervision         General transfer comment: Supervision for safety with VC for hand placement. Performed from bed, toilet, and recliner. Good stability upon standing. Educated on use of crutches and walker to safely stand.  Ambulation/Gait Ambulation/Gait assistance: Supervision Ambulation Distance (Feet): 140 Feet (140 with walker - 50 with crutches.) Assistive device: Rolling walker (2 wheeled);Crutches Gait Pattern/deviations: Step-to pattern;Decreased stance time - right   Gait velocity interpretation: Below normal speed  for age/gender General Gait Details: Educated on safe use of DME with crutches and rolling walker. Pt prefered to use crutches after practicing with both assistive devices. VC for TDWB and pt was able to maintain throughout therapy session. No loss of balance or physical assist required.  Stairs Stairs: Yes Stairs assistance: Supervision Stair Management: No rails;Step to pattern;Backwards;With walker Number of Stairs: 1 General stair comments: Educated pt on safe stair navigation with backwards approach using walker. Pt performed this task safely with no further questions. Declines practicing with crutches, states he has done this many times before and does not wish to try with PT.  Wheelchair Mobility    Modified Rankin (Stroke Patients Only)       Balance Overall balance assessment: Needs assistance Sitting-balance support: No upper extremity supported;Feet supported Sitting balance-Leahy Scale: Good     Standing balance support: No upper extremity supported Standing balance-Leahy Scale: Fair                               Pertinent Vitals/Pain Pain Assessment: 0-10 Pain Score: 8  Pain Location: right knee Pain Intervention(s): Limited activity within patient's tolerance;Monitored during session;Repositioned;RN gave pain meds during session    [place] expects to be discharged to:: Private residence Living Arrangements: Other relatives (Brother) Available Help at Discharge: Family;Available 24 hours/day Type of Home: House Home Access: Stairs to enter Entrance Stairs-Rails: Right;Left;Can reach both Entrance Stairs-Number of Steps: 1 Home Layout: Two level;Able to live on main level with bedroom/bathroom Home Equipment: Crutches      Prior Function Level of Independence: Independent               Hand  Dominance   Dominant Hand: Left    Extremity/Trunk Assessment   Upper Extremity Assessment: Defer to OT evaluation            Lower Extremity Assessment: RLE deficits/detail RLE Deficits / Details: Decreased strength and ROM as expected post op.       Communication   Communication: No difficulties  Cognition Arousal/Alertness: Awake/alert Behavior During Therapy: WFL for tasks assessed/performed Overall Cognitive Status: Within Functional Limits for tasks assessed                      General Comments      Exercises        Assessment/Plan    PT Assessment All further PT needs can be met in the next venue of care  PT Diagnosis Difficulty walking;Abnormality of gait;Acute pain   PT Problem List Decreased strength;Decreased range of motion;Decreased activity tolerance;Decreased balance;Decreased mobility;Decreased knowledge of use of DME;Pain  PT Treatment Interventions     PT Goals (Current goals can be found in the Care Plan section) Acute Rehab PT Goals Patient Stated Goal: Go home today PT Goal Formulation: No goals set, d/c therapy    Frequency     Barriers to discharge        Co-evaluation               End of Session Equipment Utilized During Treatment: Right knee immobilizer Activity Tolerance: Patient tolerated treatment well Patient left: in chair;with call bell/phone within reach Nurse Communication: Mobility status    Functional Assessment Tool Used: clinical observation Functional Limitation: Mobility: Walking and moving around Mobility: Walking and Moving Around Current Status ([id number]): At least 20 percent but less than 40 percent impaired, limited or restricted Mobility: Walking and Moving Around Goal Status [phone]): At least 20 percent but less than 40 percent impaired, limited or restricted Mobility: Walking and Moving Around Discharge Status [phone]): At least 20 percent but less than 40 percent impaired, limited or restricted    Time: 1416-1444 PT Time Calculation (min): 28 min   Charges:   PT Evaluation $Initial PT Evaluation Tier I: 1  Procedure PT Treatments $Gait Training: 8-22 mins   PT G Codes:   Functional Assessment Tool Used: clinical observation Functional Limitation: Mobility: Walking and moving around   [place], [place]   Ellouise Newer 05/10/2014, 3:57 PM

## 2014-05-10 NOTE — Progress Notes (Signed)
Patient discharged to home accompanied by family. Discharge instructions and rx given and explained and patient stated understanding. IV was removed and Hinge brace is in place. Patient left unit in a stable condition and is going to wait for ride down in discharge hospitality lounge.

## 2014-05-10 NOTE — Progress Notes (Signed)
UR completed 

## 2014-05-10 NOTE — Discharge Summary (Signed)
Physician Discharge Summary  Patient ID: Angel Vasquez MRN: [id number] DOB/AGE: October 28, 1965 48 y.o.  Admit date: 05/09/2014 Discharge date: 05/10/2014  Admission Diagnoses: R patellar tendon rupture  Discharge Diagnoses:  Active Problems:   Patellar tendon rupture   Discharged Condition: good  Hospital Course: Angel Vasquez presented to [facility] on 8/14 after a fall, resulting in a patellar tendon rupture. Patient was seen by Dr. Victorino Dike on 05/05/14 and scheduled for surgery on 05/09/14. He underwent operative repair of his tendon on 05/09/14 and tolerated the procedure well. Today, POD 1, he is doing well and has mild pain into his R knee. He has no new complaints and is eager to go home. He will be d/c home with a double-hinged knee brace and will be toe touch weight bearing with crutches. He will be on lovenox 40mg  daily for DVT prophylaxis.   Consults: None  Significant Diagnostic Studies: radiology: X-Ray: R patella tendon rupture  Treatments: surgery: operative patellar tendon repair  Discharge Exam: Blood pressure 155/90, pulse 96, temperature 98.8 F (37.1 C), temperature source Oral, resp. rate 16, height 6\' 2"  (1.88 m), weight 131.543 kg (290 lb), SpO2 99.00%. Physical exam: WD/WN male in nad. A and O x4. Mood and affect appropriate. EOMI. Respriations normal and unlabored. RLE in soft dressing with knee immobilizer and ice packs applied. Dressings C/D/I. NV intact with brisk capillary refill and 2+ bilateral distal pulses. Sensibility to light touch intact bilaterally. Ankle strength 5/5 DF and PF. Trace R quadriceps isometric contraction.  Disposition: 01-Home or Self Care  Discharge Instructions   Call MD / Call 911    Complete by:  As directed   If you experience chest pain or shortness of breath, CALL 911 and be transported to the hospital emergency room.  If you develope a fever above 101 F, pus (white drainage) or increased drainage or redness at the wound, or calf pain, call your  surgeon's office.     Constipation Prevention    Complete by:  As directed   Drink plenty of fluids.  Prune juice may be helpful.  You may use a stool softener, such as Colace (over the counter) 100 mg twice a day.  Use MiraLax (over the counter) for constipation as needed.     Diet - low sodium heart healthy    Complete by:  As directed      Discharge instructions    Complete by:  As directed   Toni Arthurs, MD [facility]  Please read the following information regarding your care after surgery.  Medications  You only need a prescription for the narcotic pain medicine (ex. oxycodone, Percocet, Norco).  All of the other medicines listed below are available over the counter. X acetominophen (Tylenol) 650 mg every 4-6 hours as you need for minor pain X oxycodone as prescribed for moderate to severe pain  Narcotic pain medicine (ex. oxycodone, Percocet, Vicodin) will cause constipation.  To prevent this problem, take the following medicines while you are taking any pain medicine. X docusate sodium (Colace) 100 mg twice a day X senna (Senokot) 2 tablets twice a day  X To help prevent blood clots, take lovenox 40mg  after surgery.  You should also get up every hour while you are awake to move around.    Weight Bearing X Bear weight with crutches when you are able on your operated leg or foot while you are wearing your double-hinged knee brace only.   Cast / Splint / Dressing X Keep your dressing  clean and dry.  Don't put anything (coat hanger, pencil, etc) down inside of it.  If it gets damp, use a hair dryer on the cool setting to dry it.  If it gets soaked, call the office to schedule an appointment for a cast change.   After your dressing, cast or splint is removed; you may shower, but do not soak or scrub the wound.  Allow the water to run over it, and then gently pat it dry.  Swelling It is normal for you to have swelling where you had surgery.  To reduce swelling and  pain, keep your toes above your nose for at least 3 days after surgery.  It may be necessary to keep your foot or leg elevated for several weeks.  If it hurts, it should be elevated.  Follow Up Call my office at [phone] when you are discharged from the hospital or surgery center to schedule an appointment to be seen two weeks after surgery.  Call my office at [phone] if you develop a fever >101.5 F, nausea, vomiting, bleeding from the surgical site or severe pain.     Increase activity slowly as tolerated    Complete by:  As directed      Touch down weight bearing    Complete by:  As directed   While wearing double-hinged knee brace, locked in extension  Laterality:  right  Extremity:  Lower            Medication List    STOP taking these medications       aspirin 325 MG tablet      TAKE these medications       DEXILANT 60 MG capsule  Generic drug:  dexlansoprazole  Take 60 mg by mouth every evening.     DULERA 100-5 MCG/ACT Aero  Generic drug:  mometasone-formoterol  Inhale 2 puffs into the lungs 2 (two) times daily.     enoxaparin 40 MG/0.4ML injection  Commonly known as:  LOVENOX  Inject 0.4 mLs (40 mg total) into the skin daily.     oxyCODONE 5 MG immediate release tablet  Commonly known as:  ROXICODONE  Take 1 tablet (5 mg total) by mouth every 4 (four) hours as needed for severe pain.     RAYOS 5 MG Tbec  Generic drug:  PredniSONE  Take 5 mg by mouth every evening.           Follow-up Information   Follow up with Toni ArthursHEWITT, JOHN, MD In 2 weeks. (For suture removal, first post-operative visit)    Specialty:  Orthopedic Surgery   Contact information:   [address] [place] [address] [phone]     -D/C to home today after up with PT and after given double-hinged knee brace, locked in extension, from [employer] -Lovenox 40mg  daily for DVT prophylaxis -Oxycodone 5mg  PO Q4-6H prn pain  -Toe touch WB on RLE with crutches   -f/u in 2 weeks for first post-op visit  Signed: Attikus Bartoszek HOWELLS 05/10/2014, 7:35 AM

## 2014-05-17 NOTE — Discharge Summary (Signed)
Agree with note above. 

## 2016-08-17 IMAGING — CR DG CHEST 2V
2 series · 2 of 2 positions shown · non-contrast
Comparison: None.

CLINICAL DATA: Preoperative respiratory evaluation prior to right
patellar tendon repair.

EXAM:
CHEST  2 VIEW

[w chest lat]
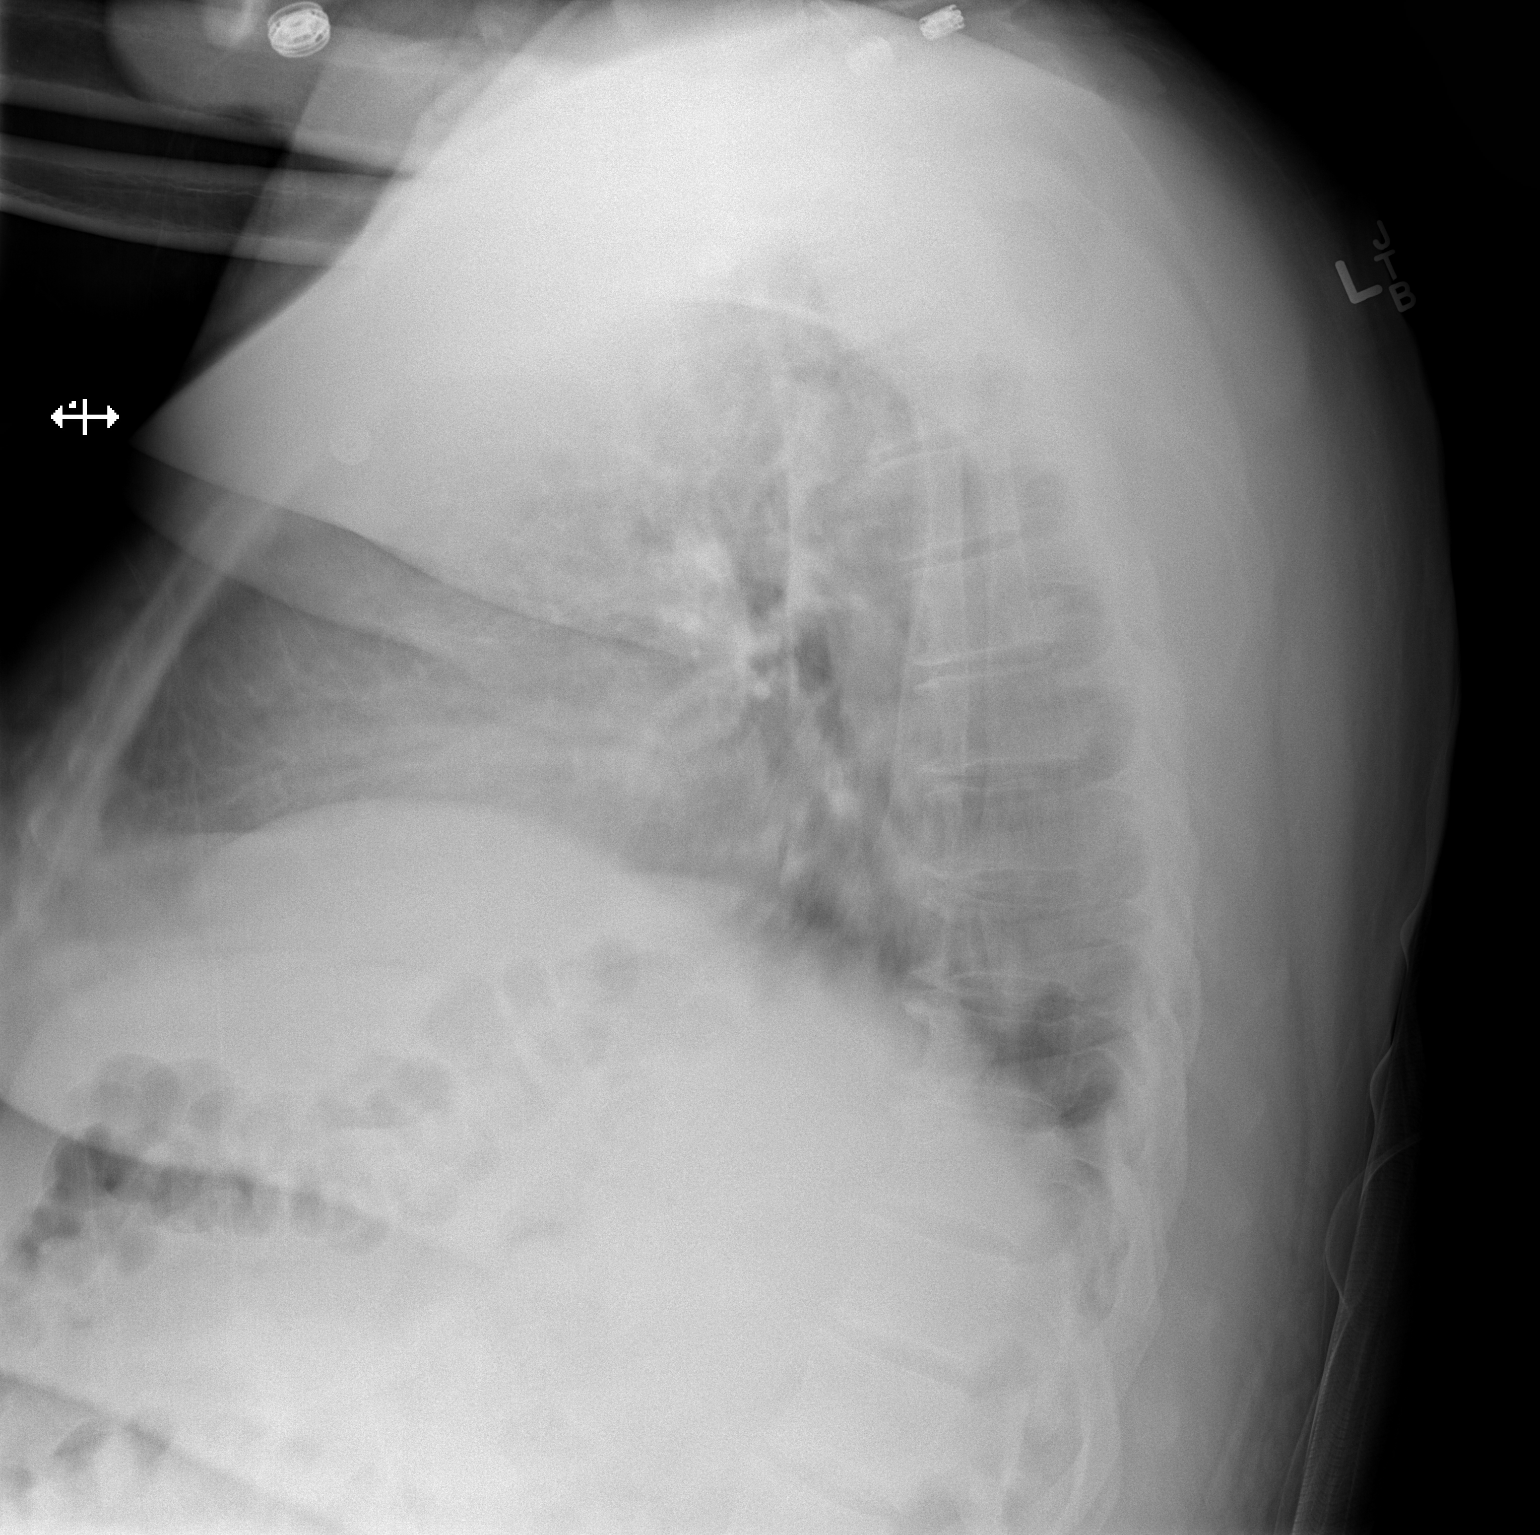

[x chest ap]
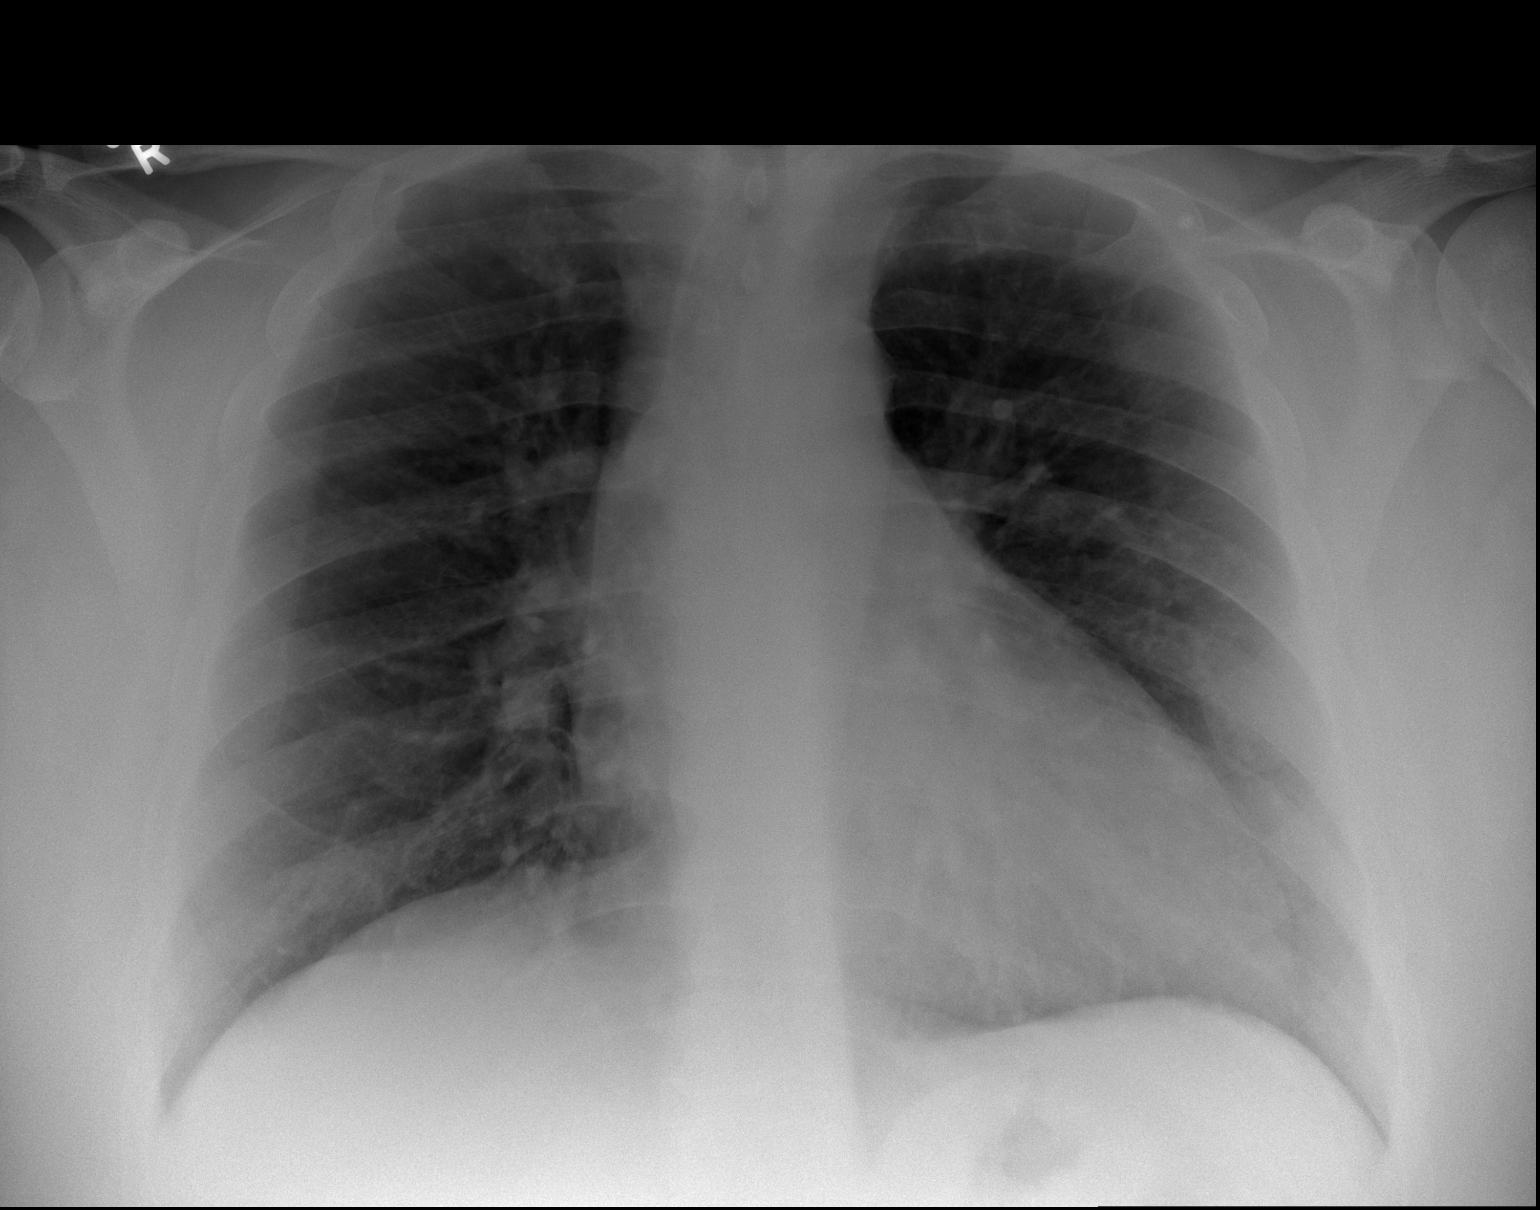

[2 of 2 positions shown; findings below may reference images not displayed]

FINDINGS: Lateral image suboptimal due to blurring by respiratory motion.

Cardiac silhouette mildly enlarged. Hilar and mediastinal contours
otherwise unremarkable. Lungs clear. Bronchovascular markings
normal. Pulmonary vascularity normal. No visible pleural effusions.
No pneumothorax. Degenerative changes involving the thoracic spine.
IMPRESSION: Mild cardiomegaly.  No acute cardiopulmonary disease.
